# Patient Record
Sex: Male | Born: 1950 | Race: White | Hispanic: No | Marital: Married | Smoking: Never smoker
Health system: Southern US, Community
[De-identification: ages and names within clinical notes are randomized; demographics above are authoritative.]

## PROBLEM LIST (undated history)

## (undated) DIAGNOSIS — B1920 Unspecified viral hepatitis C without hepatic coma: Secondary | ICD-10-CM

## (undated) DIAGNOSIS — E7800 Pure hypercholesterolemia, unspecified: Secondary | ICD-10-CM

## (undated) DIAGNOSIS — M069 Rheumatoid arthritis, unspecified: Secondary | ICD-10-CM

## (undated) DIAGNOSIS — B159 Hepatitis A without hepatic coma: Secondary | ICD-10-CM

## (undated) DIAGNOSIS — I10 Essential (primary) hypertension: Secondary | ICD-10-CM

## (undated) DIAGNOSIS — K219 Gastro-esophageal reflux disease without esophagitis: Secondary | ICD-10-CM

## (undated) DIAGNOSIS — N529 Male erectile dysfunction, unspecified: Secondary | ICD-10-CM

## (undated) DIAGNOSIS — F419 Anxiety disorder, unspecified: Secondary | ICD-10-CM

## (undated) DIAGNOSIS — E119 Type 2 diabetes mellitus without complications: Secondary | ICD-10-CM

## (undated) HISTORY — DX: Type 2 diabetes mellitus without complications: E11.9

## (undated) HISTORY — DX: Unspecified viral hepatitis C without hepatic coma: B19.20

## (undated) HISTORY — DX: Male erectile dysfunction, unspecified: N52.9

## (undated) HISTORY — DX: Rheumatoid arthritis, unspecified: M06.9

## (undated) HISTORY — DX: Anxiety disorder, unspecified: F41.9

## (undated) HISTORY — DX: Hepatitis a without hepatic coma: B15.9

## (undated) HISTORY — DX: Pure hypercholesterolemia, unspecified: E78.00

## (undated) HISTORY — DX: Gastro-esophageal reflux disease without esophagitis: K21.9

## (undated) HISTORY — DX: Essential (primary) hypertension: I10

---

## 2009-12-03 ENCOUNTER — Ambulatory Visit (HOSPITAL_BASED_OUTPATIENT_CLINIC_OR_DEPARTMENT_OTHER): Admission: RE | Admit: 2009-12-03 | Discharge: 2009-12-03 | Payer: Self-pay | Admitting: Orthopedic Surgery

## 2010-04-30 LAB — POCT I-STAT, CHEM 8
Calcium, Ion: 1.14 mmol/L (ref 1.12–1.32)
Chloride: 107 mEq/L (ref 96–112)
HCT: 48 % (ref 39.0–52.0)
Hemoglobin: 16.3 g/dL (ref 13.0–17.0)
Potassium: 4.2 mEq/L (ref 3.5–5.1)
TCO2: 27 mmol/L (ref 0–100)

## 2016-06-07 DIAGNOSIS — K219 Gastro-esophageal reflux disease without esophagitis: Secondary | ICD-10-CM | POA: Diagnosis not present

## 2016-06-07 DIAGNOSIS — I10 Essential (primary) hypertension: Secondary | ICD-10-CM | POA: Diagnosis not present

## 2016-06-07 DIAGNOSIS — M069 Rheumatoid arthritis, unspecified: Secondary | ICD-10-CM | POA: Diagnosis not present

## 2016-06-07 DIAGNOSIS — Z8249 Family history of ischemic heart disease and other diseases of the circulatory system: Secondary | ICD-10-CM | POA: Diagnosis not present

## 2016-06-07 DIAGNOSIS — R0609 Other forms of dyspnea: Secondary | ICD-10-CM | POA: Diagnosis not present

## 2016-06-07 DIAGNOSIS — R0789 Other chest pain: Secondary | ICD-10-CM | POA: Diagnosis not present

## 2016-06-07 DIAGNOSIS — E119 Type 2 diabetes mellitus without complications: Secondary | ICD-10-CM | POA: Diagnosis not present

## 2016-06-08 DIAGNOSIS — R0609 Other forms of dyspnea: Secondary | ICD-10-CM | POA: Diagnosis not present

## 2016-06-08 DIAGNOSIS — E119 Type 2 diabetes mellitus without complications: Secondary | ICD-10-CM | POA: Diagnosis not present

## 2016-06-08 DIAGNOSIS — R0789 Other chest pain: Secondary | ICD-10-CM | POA: Diagnosis not present

## 2016-06-08 DIAGNOSIS — I10 Essential (primary) hypertension: Secondary | ICD-10-CM | POA: Diagnosis not present

## 2020-03-26 ENCOUNTER — Ambulatory Visit: Payer: Commercial Managed Care - PPO | Admitting: Gastroenterology

## 2020-04-18 ENCOUNTER — Ambulatory Visit: Payer: Commercial Managed Care - PPO | Admitting: Gastroenterology

## 2020-05-17 ENCOUNTER — Telehealth: Payer: Self-pay | Admitting: Gastroenterology

## 2020-05-17 NOTE — Telephone Encounter (Signed)
Hi Dr. Chales Abrahams, this pt used to see you in [facility]. He then transferred his care to a GI with [facility]. Pt was not pleased with his experience there so he would like to see you again. He is due for repeat colon but is having issues with his stomach that he would like to discuss with you. Records are in Epic. Could you please review them and advise if it is ok to schedule pt with you. Thank you.

## 2020-05-20 NOTE — Telephone Encounter (Signed)
Absolutely Okay to schedule with me or [facility] clinic-whichever is faster RG

## 2020-05-20 NOTE — Telephone Encounter (Signed)
Left message to call back to sch ov.   

## 2020-05-21 ENCOUNTER — Ambulatory Visit: Payer: Commercial Managed Care - PPO | Admitting: Gastroenterology

## 2020-05-23 ENCOUNTER — Encounter: Payer: Self-pay | Admitting: Gastroenterology

## 2020-06-19 ENCOUNTER — Other Ambulatory Visit (INDEPENDENT_AMBULATORY_CARE_PROVIDER_SITE_OTHER): Payer: Commercial Managed Care - PPO

## 2020-06-19 ENCOUNTER — Ambulatory Visit (INDEPENDENT_AMBULATORY_CARE_PROVIDER_SITE_OTHER): Payer: Commercial Managed Care - PPO | Admitting: Gastroenterology

## 2020-06-19 ENCOUNTER — Other Ambulatory Visit: Payer: Self-pay

## 2020-06-19 ENCOUNTER — Encounter: Payer: Self-pay | Admitting: Gastroenterology

## 2020-06-19 VITALS — BP 132/82 | HR 56 | Ht 68.5 in | Wt 175.2 lb

## 2020-06-19 DIAGNOSIS — R1084 Generalized abdominal pain: Secondary | ICD-10-CM

## 2020-06-19 DIAGNOSIS — Z860100 Personal history of colon polyps, unspecified: Secondary | ICD-10-CM

## 2020-06-19 DIAGNOSIS — D509 Iron deficiency anemia, unspecified: Secondary | ICD-10-CM | POA: Diagnosis not present

## 2020-06-19 DIAGNOSIS — K219 Gastro-esophageal reflux disease without esophagitis: Secondary | ICD-10-CM

## 2020-06-19 LAB — COMPREHENSIVE METABOLIC PANEL
ALT: 17 U/L (ref 0–53)
AST: 20 U/L (ref 0–37)
Albumin: 4.2 g/dL (ref 3.5–5.2)
Alkaline Phosphatase: 66 U/L (ref 39–117)
BUN: 23 mg/dL (ref 6–23)
CO2: 29 mEq/L (ref 19–32)
Calcium: 9.4 mg/dL (ref 8.4–10.5)
Chloride: 98 mEq/L (ref 96–112)
Creatinine, Ser: 0.98 mg/dL (ref 0.40–1.50)
GFR: 79.67 mL/min (ref >60.00)
Glucose, Bld: 125 mg/dL — ABNORMAL HIGH (ref 70–99)
Potassium: 4.8 mEq/L (ref 3.5–5.1)
Sodium: 134 mEq/L — ABNORMAL LOW (ref 135–145)
Total Bilirubin: 0.4 mg/dL (ref 0.2–1.2)
Total Protein: 6.8 g/dL (ref 6.0–8.3)

## 2020-06-19 MED ORDER — OMEPRAZOLE 40 MG PO CPDR: 40.0000 mg | DELAYED_RELEASE_CAPSULE | Freq: Every day | ORAL | 3 refills | Status: DC

## 2020-06-19 MED ORDER — NA SULFATE-K SULFATE-MG SULF 17.5-3.13-1.6 GM/177ML PO SOLN: 1.0000 | Freq: Once | ORAL | 0 refills | Status: AC

## 2020-06-19 NOTE — Progress Notes (Signed)
Chief Complaint: GI complaints  Referring Provider:  Dr Shary Decamp      ASSESSMENT AND PLAN;   #1. GERD with postprandial abdo pain  #2. H/O PSBO  #3. IDA with heme -ve stools.  #4. H/O Polyps  #5. Intermittent diarrhea- likely postcholecystectomy/IBS.  Could also be related to meds like metformin/olmesartan  Plan: -Would give him a trial of omeprazole 40mg  QD #90, 4 refills -CTE to r/o SB pathology given H/O PSBO in past -EGD with SB Bx/colon for further evaluation   I discussed the nature of the recommended EGD/Colonoscopy , as well as the indications, risks, alternatives and potential complications including, but not limited to, bleeding, infection, reaction to medication, damage to internal organs, cardiac and/or pulmonary problems, and perforation requiring surgery (1 to 2 in 1000). The possibility that significant findings could be missed was explained. All ? were answered. The patient gives consent for the procedures.   HPI:    Brent Mullins is a 70 y.o. male  With GERD, DM2, RA, HTN, HLD, anxiety, s/p lap chole 2014 with choledocholithiasis s/p ERCP Accompanied by his wife  With few GI issues -GERD: Longstanding >10ys, has been on Protonix in past.  Most recently he stopped taking Protonix as his insurance company stopped covering.  No odynophagia or dysphagia.  Started having heartburn ever since he stopped Protonix.  He has been taking Tums on as needed basis.  -Postprandial epigastric pain with fullness.  He apparently was admitted to [facility] (per wife) 2 to 3 years ago with partial small bowel obstruction.  He had NG tube and was managed conservatively.  He was told that he had "adhesions". "  He has never been the same".  He does admit to having postprandial nausea but no vomiting.  He also has longstanding history of diabetes.  Never had EGD/gastric emptying scan performed.  -Had diarrhea 1/day, ever since cholecystectomy.  No nocturnal symptoms.  Denies having any  urgency.  He has been on metformin for several years.  He did not notice any increased diarrhea.  He has also been on olmesartan for the last 3 years.  Denies having any history suggestive of gluten intolerance.  -Diagnosed with IDA with most recent hemoglobin 2 months ago at 13.6, normal MCV at 91.  His iron studies showed iron saturation of 24% with ferritin of 28.  He has been seen at [facility]) and has been advised EGD/colonoscopy which he never got around scheduling it.  Had heme-negative stools in the past.  He denies having any melena or hematochezia.  No history of nosebleeds.  No hematuria.  No history of easy bruisability.  No family history of colon cancer or colonic polyps.  No alcohol.  No jaundice dark urine or pale stools.  No sodas, chocolates, chewing gums, artificial sweeteners and candy. No NSAIDs  Past GI procedures: Colonoscopy 08/2015 ([facility]) good preparation.  Colonic polyp s/p polypectomy.  Moderate predominantly sigmoid diverticulosis.  Otherwise normal to TI. Bx- neg  ERCP 06/2012: Choledocholithiasis s/p biliary sphincterotomy and stone extraction.  CT ar [facility]- per patient's wife.  I could not get any information through epic.  Report awaited. Past Medical History:  Diagnosis Date  . Anxiety   . Diabetes mellitus (HCC)   . GERD (gastroesophageal reflux disease)   . Hepatitis A   . Hepatitis-C    Dr Kinnie Scales. False positive hepatitis C antibodies  . Hypercholesterolemia   . Hypertension   . Organic impotence   . RA (rheumatoid  arthritis) [facility])     Past Surgical History:  Procedure Laterality Date  . CHOLECYSTECTOMY  2014   With choledocholithiasis s/p ERCP with ES  . COLONOSCOPY  09/11/2015   Colonic polyp status post polypectomy. Moderate predominantly sigmoid diverticulosis  . ERCP  07/15/2012   Choledocholithiasis staus post biliary sphrincterotomy with stone extraction.  Marland Kitchen HAND SURGERY Right   . INGUINAL HERNIA REPAIR  07/13/2003   S/p inguinal  herniorraphy     Family History  Problem Relation Age of Onset  . Rheumatologic disease Father     Social History   Tobacco Use  . Smoking status: Never Smoker  . Smokeless tobacco: Never Used  Substance Use Topics  . Alcohol use: Yes  . Drug use: Never    Current Outpatient Medications  Medication Sig Dispense Refill  . escitalopram (LEXAPRO) 10 MG tablet Take 1 tablet by mouth daily.    . ferrous sulfate 325 (65 FE) MG EC tablet Take 1 tablet by mouth daily with breakfast.    . metFORMIN (GLUCOPHAGE-XR) 500 MG 24 hr tablet Take by mouth.    . Multiple Vitamin (MULTIVITAMIN) capsule Take 1 capsule by mouth daily.    . Olmesartan-amLODIPine-HCTZ 40-5-25 MG TABS Take by mouth.    Dub Amis 125 MG/ML SOSY Inject into the skin.    . pantoprazole (PROTONIX) 40 MG tablet Take 1 tablet by mouth daily.    . polyvinyl alcohol (LIQUIFILM TEARS) 1.4 % ophthalmic solution Place 1 drop into both eyes as needed.     No current facility-administered medications for this visit.    No Known Allergies  Review of Systems:  Constitutional: Denies fever, chills, diaphoresis, appetite change and has fatigue.  HEENT: Denies photophobia, eye pain, redness, hearing loss, ear pain, congestion, sore throat, rhinorrhea, sneezing, mouth sores, neck pain, neck stiffness and tinnitus.   Respiratory: Denies SOB, DOE, cough, chest tightness,  and wheezing.   Cardiovascular: Denies chest pain, palpitations and leg swelling.  Genitourinary: Denies dysuria, urgency, frequency, hematuria, flank pain and difficulty urinating.  Musculoskeletal: Denies myalgias, has back pain, joint swelling, arthralgias and gait problem.  Skin: No rash.  Neurological: Denies dizziness, seizures, syncope, weakness, light-headedness, numbness and headaches.  Hematological: Denies adenopathy. Easy bruising, personal or family bleeding history  Psychiatric/Behavioral: Has anxiety or depression     Physical Exam:    BP 132/82  (BP Location: Left Arm, Patient Position: Sitting, Cuff Size: Normal)   Pulse (!) 56   Ht 5' 8.5" (1.74 m)   Wt 175 lb 4 oz (79.5 kg)   BMI 26.26 kg/m  Wt Readings from Last 3 Encounters:  06/19/20 175 lb 4 oz (79.5 kg)   Constitutional:  Well-developed, in no acute distress. Psychiatric: Normal mood and affect. Behavior is normal. HEENT: Pupils normal.  Conjunctivae are normal. No scleral icterus.  Cardiovascular: Normal rate, regular rhythm. No edema Pulmonary/chest: Effort normal and breath sounds normal. No wheezing, rales or rhonchi. Abdominal: Soft, nondistended. Nontender. Bowel sounds active throughout. There are no masses palpable. No hepatomegaly. Rectal: To be performed at the time of colonoscopy Neurological: Alert and oriented to person place and time. Skin: Skin is warm and dry. No rashes noted.  Data Reviewed: I have personally reviewed following labs and imaging studies  CBC Latest Ref Rng & Units 12/03/2009  Hemoglobin 13.0 - 17.0 g/dL 21.3  Hematocrit 08.6 - 52.0 % 48.0   CMP Latest Ref Rng & Units 12/03/2009  Glucose 70 - 99 mg/dL 578(I)  BUN 6 -  23 mg/dL 08(Q)  Creatinine 0.4 - 1.5 mg/dL 1.2  Sodium 761 - 950 mEq/L 141  Potassium 3.5 - 5.1 mEq/L 4.2  Chloride 96 - 112 mEq/L 107      Edman Circle, MD 06/19/2020, 9:37 AM  Cc: Dr. Shary Decamp

## 2020-06-19 NOTE — Patient Instructions (Addendum)
If you are age 68 or older, your body mass index should be between 23-30. Your Body mass index is 26.26 kg/m. If this is out of the aforementioned range listed, please consider follow up with your Primary Care Provider.  If you are age 62 or younger, your body mass index should be between 19-25. Your Body mass index is 26.26 kg/m. If this is out of the aformentioned range listed, please consider follow up with your Primary Care Provider.   You have been scheduled for a ct enterography at [facility] (First floor of hospital)  on May 16,2022  at  10am.   Please arrive 1 hour and 15 minutes prior to scheduled appointment time. Nothing by mouth 4 hours prior to this appointment. If you have any questions please call [phone].    Please go to the lab on the 2nd floor [address] before you leave the office today.   You have been scheduled for an endoscopy and colonoscopy. Please follow the written instructions given to you at your visit today. Please pick up your prep supplies at the pharmacy within the next 1-3 days. If you use inhalers (even only as needed), please bring them with you on the day of your procedure.  Thank you,  Dr. Lynann Bologna

## 2020-06-25 ENCOUNTER — Telehealth: Payer: Self-pay | Admitting: Gastroenterology

## 2020-06-25 NOTE — Telephone Encounter (Signed)
Wife was given [phone] to reschedule

## 2020-06-25 NOTE — Telephone Encounter (Signed)
Patients wife calling to reschedule CT appointment.

## 2020-07-01 ENCOUNTER — Ambulatory Visit (HOSPITAL_COMMUNITY): Payer: Commercial Managed Care - PPO

## 2020-07-04 ENCOUNTER — Ambulatory Visit (HOSPITAL_COMMUNITY): Payer: Commercial Managed Care - PPO

## 2020-07-17 ENCOUNTER — Encounter: Payer: Self-pay | Admitting: Gastroenterology

## 2020-07-26 ENCOUNTER — Other Ambulatory Visit: Payer: Self-pay

## 2020-07-26 ENCOUNTER — Ambulatory Visit
Admission: RE | Admit: 2020-07-26 | Discharge: 2020-07-26 | Disposition: A | Discharge status: Home / Self Care | Payer: Commercial Managed Care - PPO | Source: Ambulatory Visit | Attending: Gastroenterology | Admitting: Gastroenterology

## 2020-07-26 DIAGNOSIS — R1084 Generalized abdominal pain: Secondary | ICD-10-CM | POA: Insufficient documentation

## 2020-07-26 DIAGNOSIS — K219 Gastro-esophageal reflux disease without esophagitis: Secondary | ICD-10-CM | POA: Insufficient documentation

## 2020-07-26 DIAGNOSIS — Z86010 Personal history of colonic polyps: Secondary | ICD-10-CM | POA: Insufficient documentation

## 2020-07-26 DIAGNOSIS — D509 Iron deficiency anemia, unspecified: Secondary | ICD-10-CM | POA: Insufficient documentation

## 2020-07-26 IMAGING — CT CT ENTEROGRAPHY (ABD-PELV W/ CM)
2 of 6 series · 15 of 46 positions shown, 17 images · IV contrast (APPLIED)
Comparison: [DATE]

CLINICAL DATA: Postprandial abdominal pain, GERD, iron deficiency
anemia, status post cholecystectomy

EXAM:
CT ABDOMEN AND PELVIS WITH CONTRAST (ENTEROGRAPHY)
TECHNIQUE: Multidetector CT of the abdomen and pelvis during bolus
administration of intravenous contrast. Negative oral contrast was
given.
CONTRAST:  100mL OMNIPAQUE IOHEXOL 300 MG/ML SOLN, additional
negative oral enteric contrast

[Series 4: entero thins · axial · 0.76mm/px · z∈[-507,-81]mm · 12 of 241 slices shown, 14 images]
[im 14/241  soft-tissue]
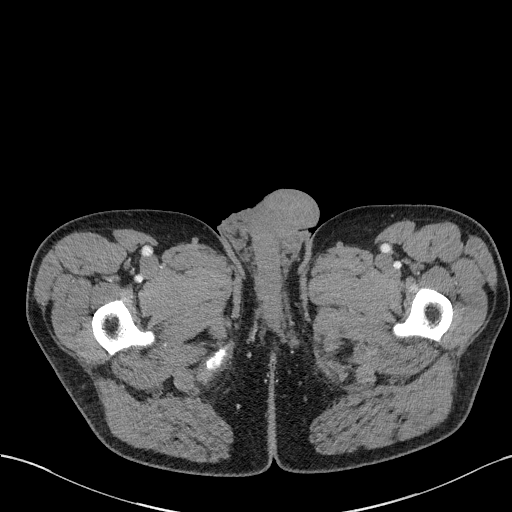
[im 14/241  bone]
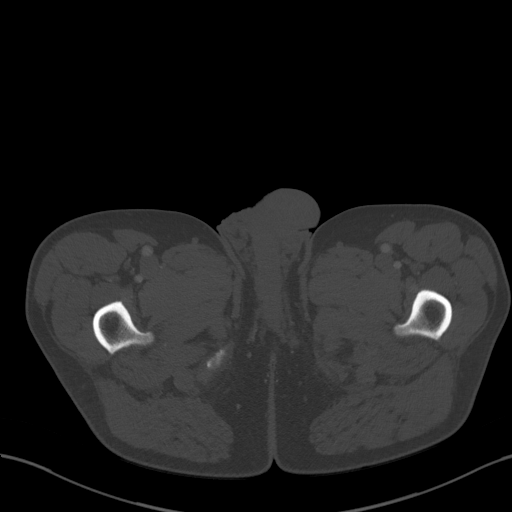
[im 41/241  soft-tissue]
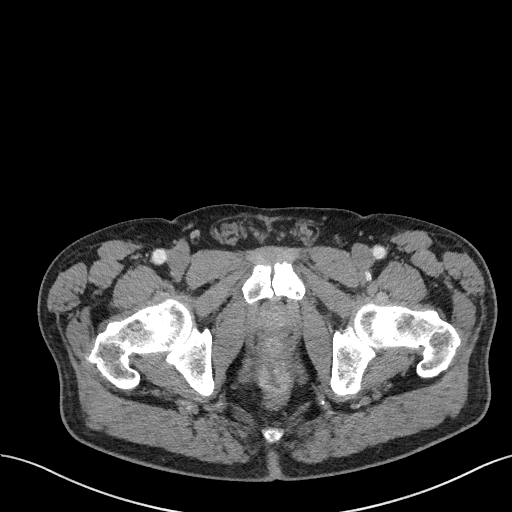
[im 54/241  soft-tissue]
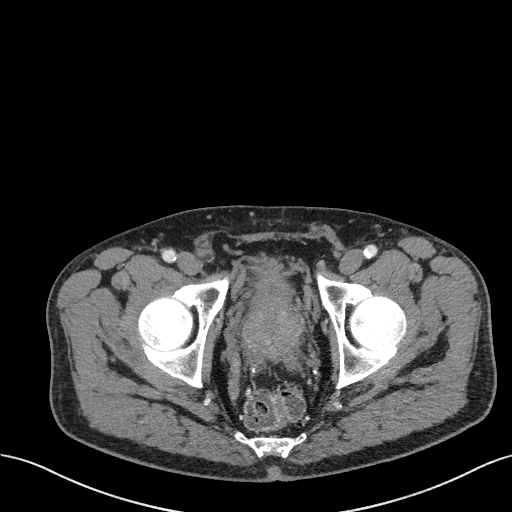
[im 67/241  soft-tissue]
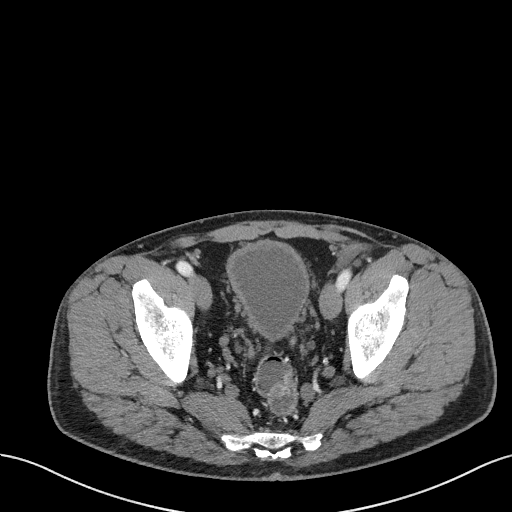
[im 94/241  soft-tissue]
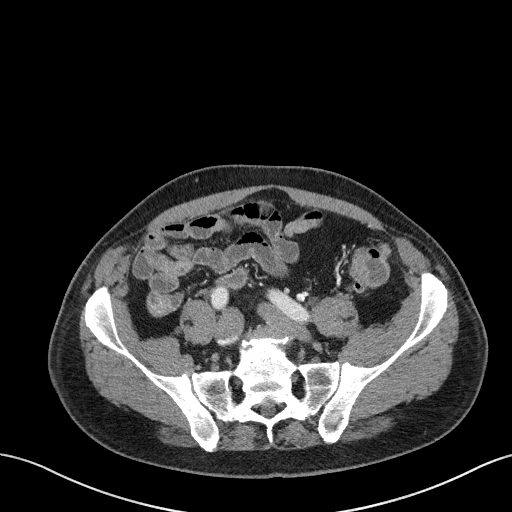
[im 107/241  soft-tissue]
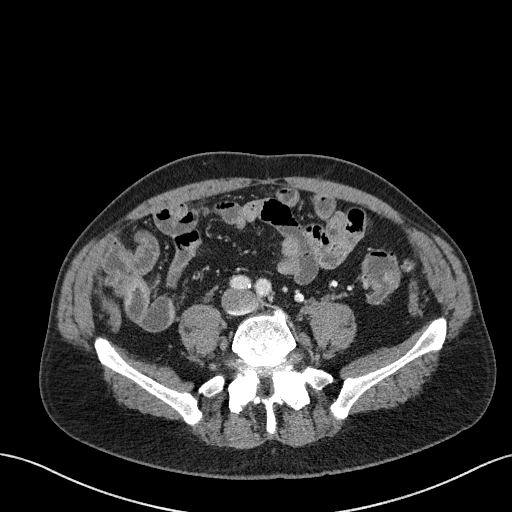
[im 134/241  soft-tissue]
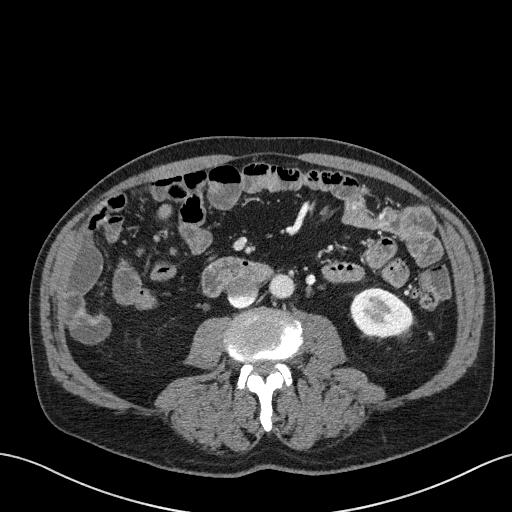
[im 147/241  soft-tissue]
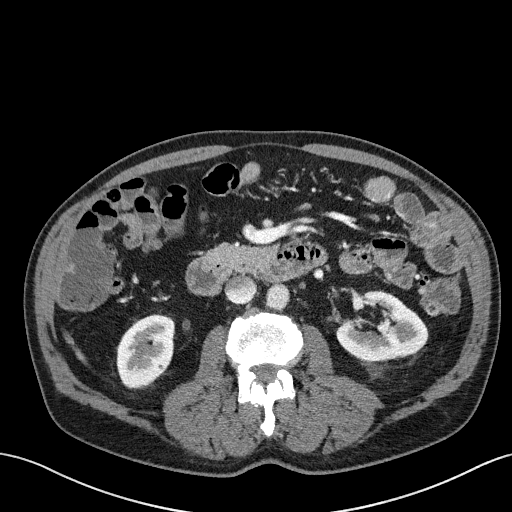
[im 174/241  soft-tissue]
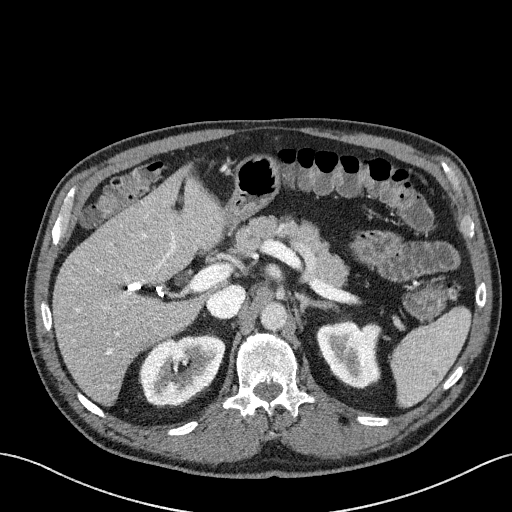
[im 174/241  bone]
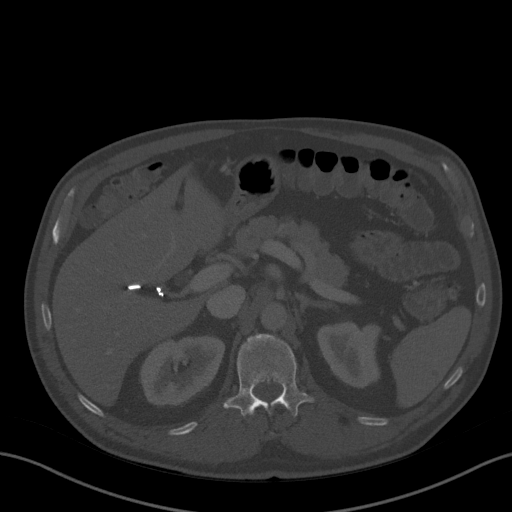
[im 187/241  soft-tissue]
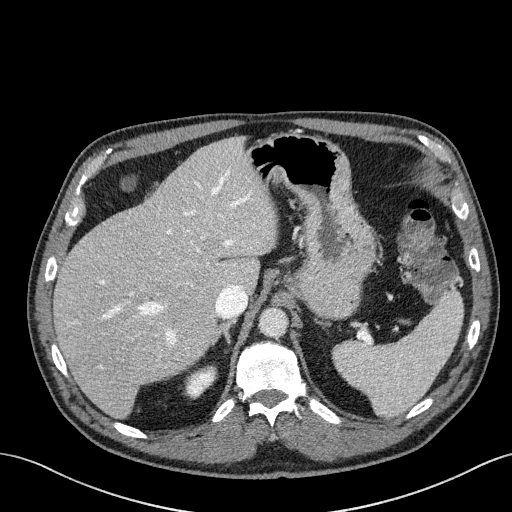
[im 201/241  soft-tissue]
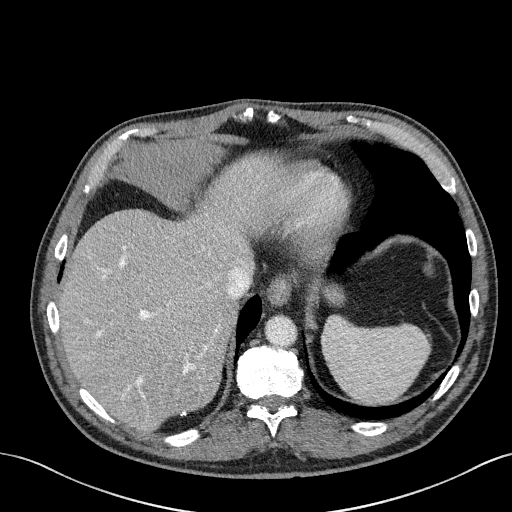
[im 227/241  soft-tissue]
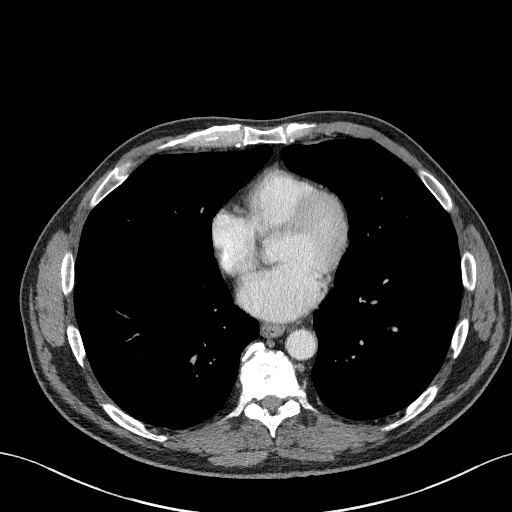

[Series 6: coronal · coronal · 0.78mm/px · 3 of 99 slices shown]
[im 33/99  soft-tissue]
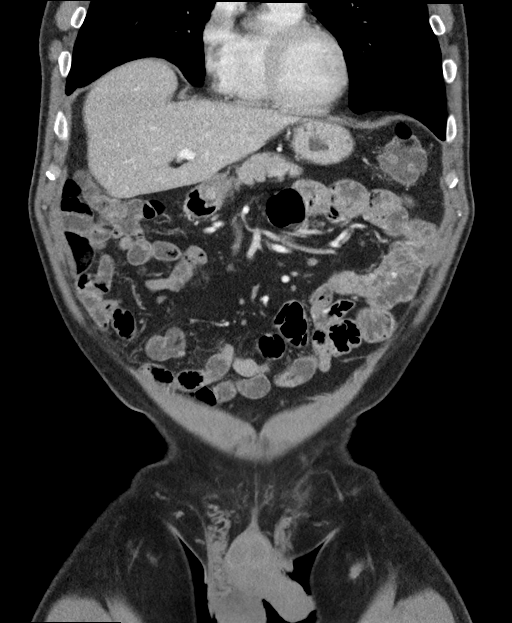
[im 44/99  soft-tissue]
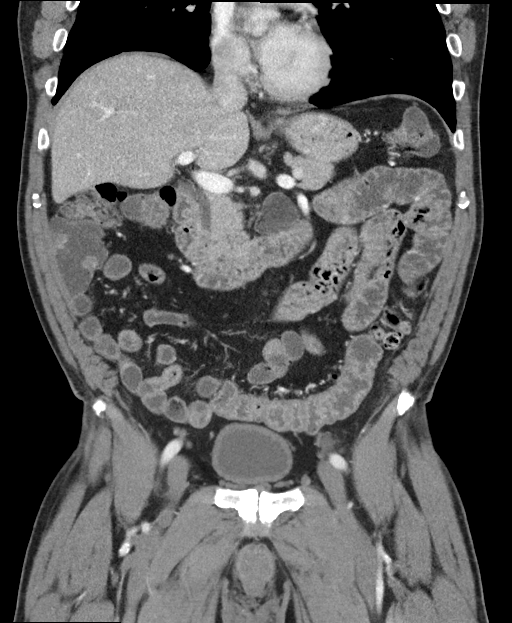
[im 55/99  soft-tissue]
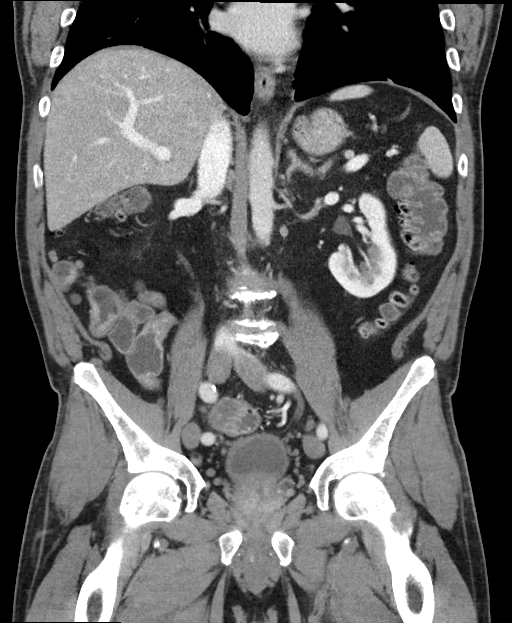

[15 of 46 positions shown; findings below may reference images not displayed]

FINDINGS: Lower chest: No acute abnormality. Stable, definitively benign 7 mm
pulmonary nodule of the left lung base (series 5, image 13).

Hepatobiliary: No focal liver abnormality is seen. Status post
cholecystectomy. No biliary dilatation.

Pancreas: Unremarkable. No pancreatic ductal dilatation or
surrounding inflammatory changes.

Spleen: Normal in size without significant abnormality.

Adrenals/Urinary Tract: Adrenal glands are unremarkable. Kidneys are
normal, without renal calculi, solid lesion, or hydronephrosis. Mild
urinary bladder wall thickening.

Stomach/Bowel: Stomach is within normal limits. Appendix appears
normal. There are multiple loops of distal ileum, which are somewhat
thickened and narrowed appearing (series 6, image 57, 48, series 2,
image 56). There is mild mucosal hyperenhancement of the sigmoid
colon (series 2, image 65). Pancolonic diverticulosis, most notable
in the descending and sigmoid colon.

Vascular/Lymphatic: No significant vascular findings are present. No
enlarged abdominal or pelvic lymph nodes.

Reproductive: No mass or other significant abnormality.

Other: No abdominal wall hernia or abnormality. No abdominopelvic
ascites.

Musculoskeletal: No acute or significant osseous findings.
IMPRESSION: 1. There are multiple loops of distal ileum, which are somewhat
thickened and narrowed appearing. One of these loops corresponds to
a previously identified nidus of distal small bowel obstruction,
which has since been relieved. This appearance presumably reflects
some degree of stricture without evidence of current obstruction.
2. There is mild mucosal hyperenhancement of the sigmoid colon.
3. Constellation of findings is generally in keeping with
inflammatory bowel disease, particularly Crohn's disease, with
minimal evidence of acute inflammation on this examination.
4. Pancolonic diverticulosis, most notable in the descending and
sigmoid colon.
5. Mild urinary bladder wall thickening, likely related to chronic
outlet obstruction.

## 2020-07-26 MED ORDER — IOHEXOL 300 MG/ML  SOLN
100.0000 mL | Freq: Once | INTRAMUSCULAR | Status: AC | PRN
  Administered 2020-07-26: 100 mL via INTRAVENOUS

## 2020-07-31 ENCOUNTER — Other Ambulatory Visit: Payer: Self-pay

## 2020-07-31 ENCOUNTER — Encounter: Payer: Self-pay | Admitting: Gastroenterology

## 2020-07-31 ENCOUNTER — Ambulatory Visit (AMBULATORY_SURGERY_CENTER): Payer: Commercial Managed Care - PPO | Admitting: Gastroenterology

## 2020-07-31 VITALS — BP 122/81 | HR 73 | Temp 97.7°F | Resp 22 | Ht 68.0 in | Wt 175.0 lb

## 2020-07-31 DIAGNOSIS — K5730 Diverticulosis of large intestine without perforation or abscess without bleeding: Secondary | ICD-10-CM

## 2020-07-31 DIAGNOSIS — Z860100 Personal history of colon polyps, unspecified: Secondary | ICD-10-CM

## 2020-07-31 DIAGNOSIS — D509 Iron deficiency anemia, unspecified: Secondary | ICD-10-CM

## 2020-07-31 DIAGNOSIS — K219 Gastro-esophageal reflux disease without esophagitis: Secondary | ICD-10-CM | POA: Diagnosis not present

## 2020-07-31 DIAGNOSIS — K2289 Other specified disease of esophagus: Secondary | ICD-10-CM

## 2020-07-31 DIAGNOSIS — K5289 Other specified noninfective gastroenteritis and colitis: Secondary | ICD-10-CM

## 2020-07-31 DIAGNOSIS — K2970 Gastritis, unspecified, without bleeding: Secondary | ICD-10-CM

## 2020-07-31 DIAGNOSIS — K449 Diaphragmatic hernia without obstruction or gangrene: Secondary | ICD-10-CM

## 2020-07-31 DIAGNOSIS — K648 Other hemorrhoids: Secondary | ICD-10-CM | POA: Diagnosis not present

## 2020-07-31 DIAGNOSIS — K3189 Other diseases of stomach and duodenum: Secondary | ICD-10-CM

## 2020-07-31 MED ORDER — PANTOPRAZOLE SODIUM 40 MG PO TBEC: 40.0000 mg | DELAYED_RELEASE_TABLET | Freq: Every day | ORAL | 3 refills | Status: AC

## 2020-07-31 MED ORDER — SODIUM CHLORIDE 0.9 % IV SOLN: 500.0000 mL | Freq: Once | INTRAVENOUS | Status: DC

## 2020-07-31 NOTE — Op Note (Signed)
[facility] Patient Name: Brent Mullins Procedure Date: 07/31/2020 2:46 PM MRN: [id number] Endoscopist: Lynann Bologna , MD Age: 70 Referring MD:  Date of Birth: 07-16-1950 Gender: Male Account #: [id number] Procedure:                Colonoscopy Indications:              High risk colon cancer surveillance: Personal                            history of colonic polyps, IDA Medicines:                Monitored Anesthesia Care Procedure:                Pre-Anesthesia Assessment:                           - Prior to the procedure, a History and Physical                            was performed, and patient medications and                            allergies were reviewed. The patient's tolerance of                            previous anesthesia was also reviewed. The risks                            and benefits of the procedure and the sedation                            options and risks were discussed with the patient.                            All questions were answered, and informed consent                            was obtained. Prior Anticoagulants: The patient has                            taken no previous anticoagulant or antiplatelet                            agents. ASA Grade Assessment: II - A patient with                            mild systemic disease. After reviewing the risks                            and benefits, the patient was deemed in                            satisfactory condition to undergo the procedure.  After obtaining informed consent, the colonoscope                            was passed under direct vision. Throughout the                            procedure, the patient's blood pressure, pulse, and                            oxygen saturations were monitored continuously. The                            Olympus CF-HQ190L (Serial# [id number]) Colonoscope was                            introduced through the anus and  advanced to the 2                            cm into the ileum. The colonoscopy was performed                            without difficulty. The patient tolerated the                            procedure well. The quality of the bowel                            preparation was good. The terminal ileum, ileocecal                            valve, appendiceal orifice, and rectum were                            photographed. Scope In: 3:09:56 PM Scope Out: 3:29:12 PM Scope Withdrawal Time: 0 hours 14 minutes 52 seconds  Total Procedure Duration: 0 hours 19 minutes 16 seconds  Findings:                 Multiple medium-mouthed diverticula were found in                            the sigmoid colon, few in descending colon and                            ascending colon.                           The colon (entire examined portion) appeared                            normal. Biopsies for histology were taken with a                            cold forceps from the entire colon for evaluation  of microscopic colitis.                           Non-bleeding internal hemorrhoids were found during                            retroflexion. The hemorrhoids were moderate.                           A localized area of mucosa in the terminal ileum                            was mildly erythematous with 2-3 small superficial                            erosions at IC valve. Biopsies were taken with a                            cold forceps for histology.                           The exam was otherwise without abnormality on                            direct and retroflexion views. Complications:            No immediate complications. Estimated Blood Loss:     Estimated blood loss: none. Impression:               - Pancolonic diverticulosis predominantly in the                            sigmoid colon.                           - Erythematous mucosa in the terminal ileum with                             few erosions. Biopsied to r/o Crohn's disease.                           - Otherwise normal colonoscopy. Recommendation:           - Patient has a contact number available for                            emergencies. The signs and symptoms of potential                            delayed complications were discussed with the                            patient. Return to normal activities tomorrow.                            Written discharge instructions were provided to the  patient.                           - Resume previous diet.                           - Continue present medications.                           - Await pathology results.                           - Repeat colonoscopy for surveillance based on                            pathology results.                           - The findings and recommendations were discussed                            with the patient's family. Lynann Bologna, MD 07/31/2020 3:41:35 PM This report has been signed electronically.

## 2020-07-31 NOTE — Progress Notes (Signed)
Pt's states no medical or surgical changes since previsit or office visit. 

## 2020-07-31 NOTE — Progress Notes (Signed)
Called to room to assist during endoscopic procedure.  Patient ID and intended procedure confirmed with present staff. Received instructions for my participation in the procedure from the performing physician.  

## 2020-07-31 NOTE — Progress Notes (Signed)
PT taken to PACU. Monitors in place. VSS. Report given to RN. 

## 2020-07-31 NOTE — Op Note (Signed)
[facility] Patient Name: Brent Mullins Procedure Date: 07/31/2020 2:47 PM MRN: [id number] Endoscopist: Lynann Bologna , MD Age: 70 Referring MD:  Date of Birth: December 14, 1950 Gender: Male Account #: [id number] Procedure:                Upper GI endoscopy Indications:              Epigastric abdominal pain. IDA, GERD Medicines:                Monitored Anesthesia Care Procedure:                Pre-Anesthesia Assessment:                           - Prior to the procedure, a History and Physical                            was performed, and patient medications and                            allergies were reviewed. The patient's tolerance of                            previous anesthesia was also reviewed. The risks                            and benefits of the procedure and the sedation                            options and risks were discussed with the patient.                            All questions were answered, and informed consent                            was obtained. Prior Anticoagulants: The patient has                            taken no previous anticoagulant or antiplatelet                            agents. ASA Grade Assessment: II - A patient with                            mild systemic disease. After reviewing the risks                            and benefits, the patient was deemed in                            satisfactory condition to undergo the procedure.                           After obtaining informed consent, the endoscope was  passed under direct vision. Throughout the                            procedure, the patient's blood pressure, pulse, and                            oxygen saturations were monitored continuously. The                            Endoscope was introduced through the mouth, and                            advanced to the second part of duodenum. The upper                            GI endoscopy was  accomplished without difficulty.                            The patient tolerated the procedure well. Scope In: Scope Out: Findings:                 Few linear and circular furrows were noted in the                            proximal and mid esophagus. Multiple biopsies were                            obtained to rule out eosinophilic esophagitis.                            There were esophageal mucosal changes suspicious                            for short-segment Barrett's esophagus present in                            the lower third of the esophagus. The maximum                            longitudinal extent of these mucosal changes was 2                            cm in length with the most proximal margin at 33                            cm, examined by NBI. This was biopsied with a cold                            forceps for histology.                           A 5 cm hiatal hernia was present extending from 35  up to 40 cm (GE junction up to diaphragmatic                            hiatus).                           Scattered mild inflammation characterized by                            erythema and granularity was found in the entire                            examined stomach. The antrum was mildly deformed.                            No acute ulcers. Multiple biopsies were taken with                            a cold forceps for histology.                           The examined duodenum was normal. Biopsies for                            histology were taken with a cold forceps for                            evaluation of celiac disease. Incidental note was                            made of a small 8 mm periampullary diverticulum. Complications:            No immediate complications. Estimated Blood Loss:     Estimated blood loss: none. Impression:               - Esophageal mucosal changes suspicious for                            short-segment  Barrett's esophagus. Biopsied.                           - 5 cm hiatal hernia.                           - Gastritis. Biopsied. Recommendation:           - Patient has a contact number available for                            emergencies. The signs and symptoms of potential                            delayed complications were discussed with the                            patient. Return to normal activities tomorrow.  Written discharge instructions were provided to the                            patient.                           - Resume previous diet.                           - Continue present medications. Resume Protonix 40                            mg p.o. once a day #30, 11 refills                           - Await pathology results.                           - Return to GI clinic in 12 weeks. Lynann Bologna, MD 07/31/2020 3:37:19 PM This report has been signed electronically.

## 2020-07-31 NOTE — Progress Notes (Signed)
C.W. vital signs. 

## 2020-07-31 NOTE — Patient Instructions (Addendum)
Take your new medication as ordered.  Read all of the handouts given to you by your recovery room nurse.  YOU HAD AN ENDOSCOPIC PROCEDURE TODAY AT THE [facility]:   Refer to the procedure report that was given to you for any specific questions about what was found during the examination.  If the procedure report does not answer your questions, please call your gastroenterologist to clarify.  If you requested that your care partner not be given the details of your procedure findings, then the procedure report has been included in a sealed envelope for you to review at your convenience later.  YOU SHOULD EXPECT: Some feelings of bloating in the abdomen. Passage of more gas than usual.  Walking can help get rid of the air that was put into your GI tract during the procedure and reduce the bloating. If you had a lower endoscopy (such as a colonoscopy or flexible sigmoidoscopy) you may notice spotting of blood in your stool or on the toilet paper. If you underwent a bowel prep for your procedure, you may not have a normal bowel movement for a few days.  Please Note:  You might notice some irritation and congestion in your nose or some drainage.  This is from the oxygen used during your procedure.  There is no need for concern and it should clear up in a day or so.  SYMPTOMS TO REPORT IMMEDIATELY:  Following lower endoscopy (colonoscopy or flexible sigmoidoscopy):  Excessive amounts of blood in the stool  Significant tenderness or worsening of abdominal pains  Swelling of the abdomen that is new, acute  Fever of 100F or higher  Following upper endoscopy (EGD)  Vomiting of blood or coffee ground material  New chest pain or pain under the shoulder blades  Painful or persistently difficult swallowing  New shortness of breath  Fever of 100F or higher  Black, tarry-looking stools  For urgent or emergent issues, a gastroenterologist can be reached at any hour by calling [phone]  [phone]. Do not use MyChart messaging for urgent concerns.    DIET:  We do recommend a small meal at first, but then you may proceed to your regular diet.  Drink plenty of fluids but you should avoid alcoholic beverages for 24 hours.  ACTIVITY:  You should plan to take it easy for the rest of today and you should NOT DRIVE or use heavy machinery until tomorrow (because of the sedation medicines used during the test).    FOLLOW UP: Our staff will call the number listed on your records 48-72 hours following your procedure to check on you and address any questions or concerns that you may have regarding the information given to you following your procedure. If we do not reach you, we will leave a message.  We will attempt to reach you two times.  During this call, we will ask if you have developed any symptoms of COVID 19. If you develop any symptoms (ie: fever, flu-like symptoms, shortness of breath, cough etc.) before then, please call [phone].  If you test positive for Covid 19 in the 2 weeks post procedure, please call and report this information to [place].    If any biopsies were taken you will be contacted by phone or by letter within the next 1-3 weeks.  Please call us at [phone] if you have not heard about the biopsies in 3 weeks.    SIGNATURES/CONFIDENTIALITY: You and/or your care partner have signed paperwork which will be  entered into your electronic medical record.  These signatures attest to the fact that that the information above on your After Visit Summary has been reviewed and is understood.  Full responsibility of the confidentiality of this discharge information lies with you and/or your care-partner.     High fiber diet,read all handouts given to you by recovery nurse.

## 2020-08-01 ENCOUNTER — Telehealth: Payer: Self-pay

## 2020-08-01 DIAGNOSIS — Z86010 Personal history of colonic polyps: Secondary | ICD-10-CM

## 2020-08-01 DIAGNOSIS — K219 Gastro-esophageal reflux disease without esophagitis: Secondary | ICD-10-CM

## 2020-08-01 DIAGNOSIS — K2970 Gastritis, unspecified, without bleeding: Secondary | ICD-10-CM

## 2020-08-01 DIAGNOSIS — D509 Iron deficiency anemia, unspecified: Secondary | ICD-10-CM

## 2020-08-01 NOTE — Telephone Encounter (Signed)
-----   Message from Lynann Bologna, MD sent at 07/31/2020  4:50 PM EDT ----- CT reviewed Had EGD/colonoscopy today.  TI did show few erosions.  Biopsies were performed.  Will await for biopsies.  Plan: -Await pathology -FU in my clinic in 2-3 weeks. -Check CBC, CMP, CRP prior to visit  RG

## 2020-08-01 NOTE — Telephone Encounter (Signed)
LVM for patient to call back. I need to talk with him about getting him scheduled back in the office.      Orders in the computer already for lab work to go to the [facility] care office on second floor

## 2020-08-02 ENCOUNTER — Telehealth: Payer: Self-pay | Admitting: *Deleted

## 2020-08-02 NOTE — Telephone Encounter (Signed)
  Follow up Call-  Call back number 07/31/2020  Post procedure Call Back phone  # [phone]  Permission to leave phone message Yes  Some recent data might be hidden     Patient questions:  Do you have a fever, pain , or abdominal swelling? No. Pain Score  0 *  Have you tolerated food without any problems? Yes.    Have you been able to return to your normal activities? Yes.    Do you have any questions about your discharge instructions: Diet   No. Medications  No. Follow up visit  No.  Do you have questions or concerns about your Care? No.  Actions: * If pain score is 4 or above: No action needed, pain <4.Have you developed a fever since your procedure? no  2.   Have you had an respiratory symptoms (SOB or cough) since your procedure? no  3.   Have you tested positive for COVID 19 since your procedure no  4.   Have you had any family members/close contacts diagnosed with the COVID 19 since your procedure?  no   If yes to any of these questions please route to Laverna Peace, RN and Karlton Lemon, RN

## 2020-08-02 NOTE — Telephone Encounter (Signed)
Appt 7/20 and lab work 7/11 due to patient's 2 week vacation

## 2020-08-07 ENCOUNTER — Encounter: Payer: Self-pay | Admitting: Gastroenterology

## 2020-08-27 NOTE — Telephone Encounter (Signed)
Inbound call from patient's wife stating he was not able to go get his lab work done and is requesting to reschedule please.  Also has questions about a medication patient is currently taking.

## 2020-08-28 NOTE — Telephone Encounter (Signed)
Called twice and LVM for patient to call back.

## 2020-08-28 NOTE — Telephone Encounter (Signed)
Wife said that patient takes Protonix 40 mg daily but when he is having symptoms of GERD he will take Prolisec 40mg . She said she doesn't think the Protonix is working for the patient. He will try to get him to do his lab work this Friday the 15th and she is aware we wanted his labs prior to his office visit on the 20th

## 2020-08-28 NOTE — Telephone Encounter (Signed)
LVM

## 2020-08-29 MED ORDER — OMEPRAZOLE 40 MG PO CPDR: 40.0000 mg | DELAYED_RELEASE_CAPSULE | Freq: Every day | ORAL | 3 refills | Status: AC

## 2020-08-29 MED ORDER — OMEPRAZOLE 40 MG PO CPDR: 40.0000 mg | DELAYED_RELEASE_CAPSULE | Freq: Every day | ORAL | 3 refills | Status: DC

## 2020-08-29 NOTE — Telephone Encounter (Signed)
If Prilosec is working better, lets switch Protonix to Prilosec 40mg  po qd, #30, 11 refills RG

## 2020-08-29 NOTE — Addendum Note (Signed)
Addended by: Alberteen Sam E on: 08/29/2020 02:33 PM   Modules accepted: Orders

## 2020-08-29 NOTE — Telephone Encounter (Signed)
Patient's wife wants [facility] now so script sent there

## 2020-08-29 NOTE — Telephone Encounter (Signed)
Patient's wife she would like to switch so medication sent to the [facility] pharmacy

## 2020-09-04 ENCOUNTER — Encounter: Payer: Self-pay | Admitting: Gastroenterology

## 2020-09-04 ENCOUNTER — Other Ambulatory Visit: Payer: Self-pay

## 2020-09-04 ENCOUNTER — Ambulatory Visit (INDEPENDENT_AMBULATORY_CARE_PROVIDER_SITE_OTHER): Payer: Commercial Managed Care - PPO | Admitting: Gastroenterology

## 2020-09-04 VITALS — BP 132/80 | HR 75 | Ht 68.0 in | Wt 175.2 lb

## 2020-09-04 DIAGNOSIS — K449 Diaphragmatic hernia without obstruction or gangrene: Secondary | ICD-10-CM | POA: Diagnosis not present

## 2020-09-04 DIAGNOSIS — K219 Gastro-esophageal reflux disease without esophagitis: Secondary | ICD-10-CM

## 2020-09-04 NOTE — Progress Notes (Signed)
Chief Complaint: FU  Referring Provider:  Dr Shary Decamp      ASSESSMENT AND PLAN;   #1. GERD with mod HH with short segment Barrett's esophagus on EGD 07/2020.  #2. Inactive Crohn's disease involving distal ileum-more fibrotic component than inflammatory.   H/O PSBO adm to [facility] 3-4 yrs ago (managed conservatively). Nl CBC, CMP, CRP (08/2020)  #3. IDA with heme -ve stools (resolved). Hb 14.6 (08/2020)   Plan: -Continue omeprazole 40mg  QD #90, 4 refills -He does understand that he is at risk of having PSBO in future.  He will promptly get in touch with in case of any problems. -At this time, since Crohn's appears to be inactive, we decided to hold off on treatment.  Besides, interestingly, he has been on Humira in past for RA and is currently on Orencia. -Rpt EGD and colon in 3 years.      HPI:    Brent Mullins is a 70 y.o. male  With GERD, DM2, RA(humira stopped working, now on 79, HTN, HLD, anxiety, s/p lap chole 2014 with choledocholithiasis s/p ERCP  Very pleasant patient.  Underwent CTE 07/2020 showing multiple loops of distal ileum with thickening c/w Crohn's disease.  He underwent colonoscopy which showed few erosions in TI. Bx came back as inactive Crohn's disease.  EGD showed 5 cm HH with short segment Barrett's esophagus without dysplasia.  He had normal CBC, CMP and CRP.  His GI symptoms have more or less resolved completely.  No further heartburn, nausea, vomiting, abdominal pain or abdominal distention.  Interestingly, he has been on Humira for several years due to RA and has recently been switched to Orencia (selective T-cell cord stimulation blocker)-which likely is effective for Crohn's disease as well.  Also, he took several courses of steroids for rheumatoid arthritis in past.    Past GI procedures:  EGD 07/2020 - Esophageal mucosal changes suspicious for short-segment Barrett's esophagus. Biopsied. - 5 cm hiatal hernia. - Gastritis.  Biopsied.  Colon 07/2020 - Pancolonic diverticulosis predominantly in the sigmoid colon. - Erythematous mucosa in the terminal ileum with few erosions. Bx-inactive Crohn's disease. - Otherwise normal colonoscopy.  CTE 07/2020 1. There are multiple loops of distal ileum, which are somewhat thickened and narrowed appearing. One of these loops corresponds to a previously identified nidus of distal small bowel obstruction, which has since been relieved. This appearance presumably reflects some degree of stricture without evidence of current obstruction. 2. There is mild mucosal hyperenhancement of the sigmoid colon. 3. Constellation of findings is generally in keeping with inflammatory bowel disease, particularly Crohn's disease, with minimal evidence of acute inflammation on this examination. 4. Pancolonic diverticulosis, most notable in the descending and sigmoid colon. 5. Mild urinary bladder wall thickening, likely related to chronic outlet obstruction   Colonoscopy 08/2015 ([facility]) good preparation.  Colonic polyp s/p polypectomy.  Moderate predominantly sigmoid diverticulosis.  Otherwise normal to TI. Bx- neg  ERCP 06/2012: Choledocholithiasis s/p biliary sphincterotomy and stone extraction.  CT ar [facility]- per patient's wife.  I could not get any information through epic.  Report awaited. Past Medical History:  Diagnosis Date   Anxiety    Diabetes mellitus (HCC)    GERD (gastroesophageal reflux disease)    Hepatitis A    Hepatitis-C    Dr 07/2012. False positive hepatitis C antibodies   Hypercholesterolemia    Hypertension    Organic impotence    RA (rheumatoid arthritis) (HCC)     Past Surgical History:  Procedure Laterality Date  CHOLECYSTECTOMY  2014   With choledocholithiasis s/p ERCP with ES   COLONOSCOPY  09/11/2015   Colonic polyp status post polypectomy. Moderate predominantly sigmoid diverticulosis   ERCP  07/15/2012   Choledocholithiasis staus post biliary  sphrincterotomy with stone extraction.   HAND SURGERY Right    INGUINAL HERNIA REPAIR  07/13/2003   S/p inguinal herniorraphy     Family History  Problem Relation Age of Onset   Rheumatologic disease Father    Colon cancer Neg Hx    Liver disease Neg Hx    Esophageal cancer Neg Hx    Pancreatic cancer Neg Hx    Stomach cancer Neg Hx     Social History   Tobacco Use   Smoking status: Never   Smokeless tobacco: Never  Substance Use Topics   Alcohol use: Yes   Drug use: Never    Current Outpatient Medications  Medication Sig Dispense Refill   escitalopram (LEXAPRO) 10 MG tablet Take 1 tablet by mouth daily.     ferrous sulfate 325 (65 FE) MG EC tablet Take 1 tablet by mouth daily with breakfast.     metFORMIN (GLUCOPHAGE-XR) 500 MG 24 hr tablet Take by mouth.     Multiple Vitamin (MULTIVITAMIN) capsule Take 1 capsule by mouth daily.     Olmesartan-amLODIPine-HCTZ 40-5-25 MG TABS Take by mouth.     omeprazole (PRILOSEC) 40 MG capsule Take 1 capsule (40 mg total) by mouth daily. 90 capsule 3   ORENCIA 125 MG/ML SOSY Inject into the skin.     pantoprazole (PROTONIX) 40 MG tablet Take 1 tablet (40 mg total) by mouth daily. 90 tablet 3   polyvinyl alcohol (LIQUIFILM TEARS) 1.4 % ophthalmic solution Place 1 drop into both eyes as needed.     No current facility-administered medications for this visit.    No Known Allergies  Review of Systems:  neg     Physical Exam:    BP 132/80   Pulse 75   Ht 5\' 8"  (1.727 m)   Wt 175 lb 4 oz (79.5 kg)   SpO2 98%   BMI 26.65 kg/m  Wt Readings from Last 3 Encounters:  09/04/20 175 lb 4 oz (79.5 kg)  07/31/20 175 lb (79.4 kg)  06/19/20 175 lb 4 oz (79.5 kg)   Constitutional:  Well-developed, in no acute distress. Psychiatric: Normal mood and affect. Behavior is normal. Pulmonary/chest: Effort normal and breath sounds normal. No wheezing, rales or rhonchi. Abdominal: Soft, nondistended. Nontender. Bowel sounds active throughout.  There are no masses palpable. No hepatomegaly. Neurological: Alert and oriented to person place and time. Skin: Skin is warm and dry. No rashes noted.   08/19/20, MD 09/04/2020, 2:42 PM  Cc: Dr. 09/06/2020

## 2020-09-04 NOTE — Patient Instructions (Addendum)
If you are age 68 or older, your body mass index should be between 23-30. Your Body mass index is 26.65 kg/m. If this is out of the aforementioned range listed, please consider follow up with your Primary Care Provider.  If you are age 85 or younger, your body mass index should be between 19-25. Your Body mass index is 26.65 kg/m. If this is out of the aformentioned range listed, please consider follow up with your Primary Care Provider.   __________________________________________________________  The [facility] providers would like to encourage you to use [facility] to communicate with providers for non-urgent requests or questions.  Due to long hold times on the telephone, sending your provider a message by [facility] may be a faster and more efficient way to get a response.  Please allow 48 business hours for a response.  Please remember that this is for non-urgent requests.   We have sent the following medications to your pharmacy for you to pick up at your convenience: Omeprazole at [facility]  Please call in 1 year to schedule an office visit.   Please call with any questions or concerns.  Thank you,  Dr. Lynann Bologna

## 2023-10-20 ENCOUNTER — Encounter: Payer: Self-pay | Admitting: Gastroenterology

## 2023-12-01 ENCOUNTER — Ambulatory Visit (AMBULATORY_SURGERY_CENTER)

## 2023-12-01 VITALS — Ht 69.0 in | Wt 167.4 lb

## 2023-12-01 DIAGNOSIS — K219 Gastro-esophageal reflux disease without esophagitis: Secondary | ICD-10-CM

## 2023-12-01 DIAGNOSIS — K50119 Crohn's disease of large intestine with unspecified complications: Secondary | ICD-10-CM

## 2023-12-01 MED ORDER — NA SULFATE-K SULFATE-MG SULF 17.5-3.13-1.6 GM/177ML PO SOLN: 1.0000 | Freq: Once | ORAL | 0 refills | Status: AC

## 2023-12-01 NOTE — Progress Notes (Signed)

## 2023-12-15 ENCOUNTER — Ambulatory Visit (AMBULATORY_SURGERY_CENTER): Admitting: Gastroenterology

## 2023-12-15 ENCOUNTER — Encounter: Payer: Self-pay | Admitting: Gastroenterology

## 2023-12-15 VITALS — BP 110/60 | HR 64 | Temp 98.5°F | Resp 13 | Ht 69.0 in | Wt 167.4 lb

## 2023-12-15 DIAGNOSIS — K621 Rectal polyp: Secondary | ICD-10-CM | POA: Diagnosis not present

## 2023-12-15 DIAGNOSIS — K640 First degree hemorrhoids: Secondary | ICD-10-CM

## 2023-12-15 DIAGNOSIS — K219 Gastro-esophageal reflux disease without esophagitis: Secondary | ICD-10-CM

## 2023-12-15 DIAGNOSIS — K633 Ulcer of intestine: Secondary | ICD-10-CM

## 2023-12-15 DIAGNOSIS — K2950 Unspecified chronic gastritis without bleeding: Secondary | ICD-10-CM | POA: Diagnosis not present

## 2023-12-15 DIAGNOSIS — K5730 Diverticulosis of large intestine without perforation or abscess without bleeding: Secondary | ICD-10-CM

## 2023-12-15 DIAGNOSIS — K2270 Barrett's esophagus without dysplasia: Secondary | ICD-10-CM | POA: Diagnosis not present

## 2023-12-15 DIAGNOSIS — K449 Diaphragmatic hernia without obstruction or gangrene: Secondary | ICD-10-CM | POA: Diagnosis not present

## 2023-12-15 DIAGNOSIS — K50119 Crohn's disease of large intestine with unspecified complications: Secondary | ICD-10-CM

## 2023-12-15 DIAGNOSIS — D128 Benign neoplasm of rectum: Secondary | ICD-10-CM

## 2023-12-15 DIAGNOSIS — K5000 Crohn's disease of small intestine without complications: Secondary | ICD-10-CM

## 2023-12-15 MED ORDER — OMEPRAZOLE 40 MG PO CPDR
40.0000 mg | DELAYED_RELEASE_CAPSULE | Freq: Two times a day (BID) | ORAL | 4 refills | Status: AC
Start: 2023-12-15 — End: ?

## 2023-12-15 MED ORDER — SODIUM CHLORIDE 0.9 % IV SOLN: 500.0000 mL | INTRAVENOUS | Status: DC

## 2023-12-15 NOTE — Patient Instructions (Signed)
 YOU HAD AN ENDOSCOPIC PROCEDURE TODAY AT THE [facility]:   Refer to the procedure report that was given to you for any specific questions about what was found during the examination.  If the procedure report does not answer your questions, please call your gastroenterologist to clarify.  If you requested that your care partner not be given the details of your procedure findings, then the procedure report has been included in a sealed envelope for you to review at your convenience later.  YOU SHOULD EXPECT: Some feelings of bloating in the abdomen. Passage of more gas than usual.  Walking can help get rid of the air that was put into your GI tract during the procedure and reduce the bloating. If you had a lower endoscopy (such as a colonoscopy or flexible sigmoidoscopy) you may notice spotting of blood in your stool or on the toilet paper. If you underwent a bowel prep for your procedure, you may not have a normal bowel movement for a few days.  Please Note:  You might notice some irritation and congestion in your nose or some drainage.  This is from the oxygen used during your procedure.  There is no need for concern and it should clear up in a day or so.  SYMPTOMS TO REPORT IMMEDIATELY:  Following lower endoscopy (colonoscopy or flexible sigmoidoscopy):  Excessive amounts of blood in the stool  Significant tenderness or worsening of abdominal pains  Swelling of the abdomen that is new, acute  Fever of 100F or higher  Following upper endoscopy (EGD)  Vomiting of blood or coffee ground material  New chest pain or pain under the shoulder blades  Painful or persistently difficult swallowing  New shortness of breath  Fever of 100F or higher  Black, tarry-looking stools  Resume previous diet Continue present medications Await pathology results Recommend to check B12 periodically.  Patient is to promptly get in touch with us  in case of any symptoms No repeat colonoscopy is  recommended for screening purposes Handouts on diverticulosis, hiatal hernia and hemorrhoids given.    For urgent or emergent issues, a gastroenterologist can be reached at any hour by calling [phone]. Do not use MyChart messaging for urgent concerns.    DIET:  We do recommend a small meal at first, but then you may proceed to your regular diet.  Drink plenty of fluids but you should avoid alcoholic beverages for 24 hours.  ACTIVITY:  You should plan to take it easy for the rest of today and you should NOT DRIVE or use heavy machinery until tomorrow (because of the sedation medicines used during the test).    FOLLOW UP: Our staff will call the number listed on your records the next business day following your procedure.  We will call around 7:15- 8:00 am to check on you and address any questions or concerns that you may have regarding the information given to you following your procedure. If we do not reach you, we will leave a message.     If any biopsies were taken you will be contacted by phone or by letter within the next 1-3 weeks.  Please call us  at [phone] if you have not heard about the biopsies in 3 weeks.    SIGNATURES/CONFIDENTIALITY: You and/or your care partner have signed paperwork which will be entered into your electronic medical record.  These signatures attest to the fact that that the information above on your After Visit Summary has been reviewed and is  understood.  Full responsibility of the confidentiality of this discharge information lies with you and/or your care-partner.

## 2023-12-15 NOTE — Progress Notes (Signed)
 Sedate, gd SR, tolerated procedure well, VSS, report to RN

## 2023-12-15 NOTE — Op Note (Addendum)
 [facility] Patient Name: Brent Mullins Procedure Date: 12/15/2023 8:57 AM MRN: [id number] Endoscopist: Lynnie Bring , MD, [id number] Age: 73 Referring MD:  Date of Birth: 1950-08-20 Gender: Male Account #: [id number] Procedure:                Colonoscopy Indications:              Screening for colorectal malignant neoplasm. H/O                            mild Crohn's disease involving terminal ileum. Medicines:                Monitored Anesthesia Care Procedure:                Pre-Anesthesia Assessment:                           - Prior to the procedure, a History and Physical                            was performed, and patient medications and                            allergies were reviewed. The patient's tolerance of                            previous anesthesia was also reviewed. The risks                            and benefits of the procedure and the sedation                            options and risks were discussed with the patient.                            All questions were answered, and informed consent                            was obtained. Prior Anticoagulants: The patient has                            taken no anticoagulant or antiplatelet agents. ASA                            Grade Assessment: II - A patient with mild systemic                            disease. After reviewing the risks and benefits,                            the patient was deemed in satisfactory condition to                            undergo the procedure.  After obtaining informed consent, the colonoscope                            was passed under direct vision. Throughout the                            procedure, the patient's blood pressure, pulse, and                            oxygen saturations were monitored continuously. The                            Olympus Scope CF SN: [id number] was introduced through                            the anus  and advanced to the 4 cm into the ileum.                            The colonoscopy was performed without difficulty.                            The patient tolerated the procedure well. The                            quality of the bowel preparation was adequate to                            identify polyps. The terminal ileum, ileocecal                            valve, appendiceal orifice, and rectum were                            photographed. Scope In: 9:12:03 AM Scope Out: 9:29:53 AM Scope Withdrawal Time: 0 hours 11 minutes 50 seconds  Total Procedure Duration: 0 hours 17 minutes 50 seconds  Findings:                 One small erosion was noted at the ileocecal                            valve/distal terminal ileum. Otherwise TI was                            unremarkable. No strictures were noted. 4 to 5 cm                            the terminal ileum was intubated. Biopsies were                            taken with a cold forceps for histology. The                            colonic mucosa was normal throughout  with                            well-preserved vascular pattern. No evidence of                            perianal Crohn's disease.                           Multiple medium-mouthed diverticula were found in                            the sigmoid colon, descending colon and ascending                            colon. One 2 mm polyp was noted in the rectum.                            Removed by cold biopsy forceps.                           Non-bleeding internal hemorrhoids were found during                            retroflexion. The hemorrhoids were small and Grade                            I (internal hemorrhoids that do not prolapse).                           Retroflexion in the right colon was performed.                           The exam was otherwise without abnormality on                            direct and retroflexion views. Complications:            No  immediate complications. Estimated Blood Loss:     Estimated blood loss: none. Impression:               - Single erosion in terminal ileum. No strictures                            or active Crohn's otherwise. Biopsied.                           - Small rectal polyp s/p polypectomy.                           - Moderate pancolonic diverticulosis.                           - Non-bleeding internal hemorrhoids.                           - The examination was  otherwise normal on direct                            and retroflexion views. Recommendation:           - Patient has a contact number available for                            emergencies. The signs and symptoms of potential                            delayed complications were discussed with the                            patient. Return to normal activities tomorrow.                            Written discharge instructions were provided to the                            patient.                           - Resume previous diet.                           - Continue present medications.                           - Await pathology results.                           - No therapy needed for above. Recommend to check                            B12 periodically. Patient is to promptly get in                            touch with us  in case of any symptoms.                           - Repeat colonoscopy is not recommended for                            screening purposes.                           - The findings and recommendations were discussed                            with the patient's family. Lynnie Bring, MD 12/15/2023 9:38:57 AM This report has been signed electronically.

## 2023-12-15 NOTE — Progress Notes (Signed)
 Chief Complaint: FU  Referring Provider:  Dr Thurmond      ASSESSMENT AND PLAN;   #1. GERD with mod HH with short segment Barrett's esophagus on EGD 07/2020.  #2. Inactive Crohn's disease involving distal ileum-more fibrotic component than inflammatory.   H/O PSBO adm to [facility] 6-8 yrs ago (managed conservatively). Nl CBC, CMP, CRP (08/2020)  #3. IDA with heme -ve stools (resolved). Hb 14.6 (08/2020)   Plan: -Continue protonix  40mg  QD #90, 4 refills -He does understand that he is at risk of having PSBO in future.  He will promptly get in touch with us  in case of any problems. -At this time, since Crohn's appears to be inactive, we decided to hold off on treatment.  Besides, interestingly, he has been on Humira in past for RA and is currently on Orencia. -Rpt EGD and colon in 3 years.      HPI:    Brent Mullins is a 73 y.o. male  With GERD, DM2, RA(humira stopped working, now on orencia, HTN, HLD, anxiety, s/p lap chole 2014 with choledocholithiasis s/p ERCP  Very pleasant patient.  Underwent CTE 07/2020 showing multiple loops of distal ileum with thickening c/w Crohn's disease.  He underwent colonoscopy which showed few erosions in TI. Bx came back as inactive Crohn's disease.  EGD showed 5 cm HH with short segment Barrett's esophagus without dysplasia.  He had normal CBC, CMP and CRP.  His GI symptoms have more or less resolved completely.  No further heartburn, nausea, vomiting, abdominal pain or abdominal distention.  Interestingly, he has been on Humira for several years due to RA and has recently been switched to Orencia (selective T-cell cord stimulation blocker)-which likely is effective for Crohn's disease as well.  Also, he took several courses of steroids for rheumatoid arthritis in past.    Past GI procedures:  EGD 07/2020 - Esophageal mucosal changes suspicious for short-segment Barrett's esophagus. Biopsied. - 5 cm hiatal hernia. - Gastritis.  Biopsied.  Colon 07/2020 - Pancolonic diverticulosis predominantly in the sigmoid colon. - Erythematous mucosa in the terminal ileum with few erosions. Bx-inactive Crohn's disease. - Otherwise normal colonoscopy.  CTE 07/2020 1. There are multiple loops of distal ileum, which are somewhat thickened and narrowed appearing. One of these loops corresponds to a previously identified nidus of distal small bowel obstruction, which has since been relieved. This appearance presumably reflects some degree of stricture without evidence of current obstruction. 2. There is mild mucosal hyperenhancement of the sigmoid colon. 3. Constellation of findings is generally in keeping with inflammatory bowel disease, particularly Crohn's disease, with minimal evidence of acute inflammation on this examination. 4. Pancolonic diverticulosis, most notable in the descending and sigmoid colon. 5. Mild urinary bladder wall thickening, likely related to chronic outlet obstruction   Colonoscopy 08/2015 ([facility]) good preparation.  Colonic polyp s/p polypectomy.  Moderate predominantly sigmoid diverticulosis.  Otherwise normal to TI. Bx- neg  ERCP 06/2012: Choledocholithiasis s/p biliary sphincterotomy and stone extraction.  CT ar [facility]- per patient's wife.  I could not get any information through epic.  Report awaited. Past Medical History:  Diagnosis Date   Anxiety    Diabetes mellitus (HCC)    GERD (gastroesophageal reflux disease)    Hepatitis A    Hepatitis-C    Dr Luis. False positive hepatitis C antibodies   Hypercholesterolemia    Hypertension    Organic impotence    RA (rheumatoid arthritis) (HCC)     Past Surgical History:  Procedure Laterality Date  CHOLECYSTECTOMY  2014   With choledocholithiasis s/p ERCP with ES   COLONOSCOPY  09/11/2015   Colonic polyp status post polypectomy. Moderate predominantly sigmoid diverticulosis   ERCP  07/15/2012   Choledocholithiasis staus post biliary  sphrincterotomy with stone extraction.   HAND SURGERY Right    INGUINAL HERNIA REPAIR  07/13/2003   S/p inguinal herniorraphy     Family History  Problem Relation Age of Onset   Rheumatologic disease Father    Colon cancer Neg Hx    Liver disease Neg Hx    Esophageal cancer Neg Hx    Pancreatic cancer Neg Hx    Stomach cancer Neg Hx    Rectal cancer Neg Hx     Social History   Tobacco Use   Smoking status: Never   Smokeless tobacco: Never  Vaping Use   Vaping status: Never Used  Substance Use Topics   Alcohol use: Not Currently    Comment: socially   Drug use: Never    Current Outpatient Medications  Medication Sig Dispense Refill   acetaminophen (TYLENOL) 650 MG CR tablet Take 650 mg by mouth every 8 (eight) hours as needed.     escitalopram (LEXAPRO) 10 MG tablet Take 1 tablet by mouth daily.     metFORMIN (GLUCOPHAGE-XR) 500 MG 24 hr tablet Take by mouth.     Olmesartan-amLODIPine-HCTZ 40-5-25 MG TABS Take by mouth.     omeprazole  (PRILOSEC) 40 MG capsule Take 1 capsule (40 mg total) by mouth daily. 90 capsule 3   polyvinyl alcohol (LIQUIFILM TEARS) 1.4 % ophthalmic solution Place 1 drop into both eyes as needed.     sildenafil (REVATIO) 20 MG tablet Take 20 mg by mouth as needed.     ferrous sulfate 325 (65 FE) MG EC tablet Take 1 tablet by mouth daily with breakfast.     Multiple Vitamin (MULTIVITAMIN) capsule Take 1 capsule by mouth daily.     ORENCIA 125 MG/ML SOSY Inject into the skin.     pantoprazole  (PROTONIX ) 40 MG tablet Take 1 tablet (40 mg total) by mouth daily. 90 tablet 3   Current Facility-Administered Medications  Medication Dose Route Frequency Provider Last Rate Last Admin   0.9 %  sodium chloride  infusion  500 mL Intravenous Continuous Charlanne Groom, MD        Allergies  Allergen Reactions   Empagliflozin Other (See Comments)    Urinary frequency, dizziness    Review of Systems:  neg     Physical Exam:    BP (!) 152/97   Pulse 64    Temp 98.5 F (36.9 C) (Temporal)   Ht 5' 9 (1.753 m)   Wt 167 lb 6.4 oz (75.9 kg)   SpO2 98%   BMI 24.72 kg/m  Wt Readings from Last 3 Encounters:  12/15/23 167 lb 6.4 oz (75.9 kg)  12/01/23 167 lb 6.4 oz (75.9 kg)  09/04/20 175 lb 4 oz (79.5 kg)   Constitutional:  Well-developed, in no acute distress. Psychiatric: Normal mood and affect. Behavior is normal. Pulmonary/chest: Effort normal and breath sounds normal. No wheezing, rales or rhonchi. Abdominal: Soft, nondistended. Nontender. Bowel sounds active throughout. There are no masses palpable. No hepatomegaly. Neurological: Alert and oriented to person place and time. Skin: Skin is warm and dry. No rashes noted.   Anselm Charlanne, MD 12/15/2023, 8:55 AM  Cc: Dr. Grisso

## 2023-12-15 NOTE — Progress Notes (Signed)
 Pt's states no medical or surgical changes since previsit or office visit.

## 2023-12-15 NOTE — Op Note (Signed)
 [facility] Patient Name: Brent Mullins Procedure Date: 12/15/2023 8:57 AM MRN: [id number] Endoscopist: Lynnie Bring , MD, [id number] Age: 73 Referring MD:  Date of Birth: [birth year] Gender: Male Account #: [id number] Procedure:                Upper GI endoscopy Indications:              H/O Barrett's esophagus Medicines:                Monitored Anesthesia Care Procedure:                Pre-Anesthesia Assessment:                           - Prior to the procedure, a History and Physical                            was performed, and patient medications and                            allergies were reviewed. The patient's tolerance of                            previous anesthesia was also reviewed. The risks                            and benefits of the procedure and the sedation                            options and risks were discussed with the patient.                            All questions were answered, and informed consent                            was obtained. Prior Anticoagulants: The patient has                            taken no anticoagulant or antiplatelet agents. ASA                            Grade Assessment: II - A patient with mild systemic                            disease. After reviewing the risks and benefits,                            the patient was deemed in satisfactory condition to                            undergo the procedure.                           After obtaining informed consent, the endoscope was  passed under direct vision. Throughout the                            procedure, the patient's blood pressure, pulse, and                            oxygen saturations were monitored continuously. The                            Olympus Scope [id number] was introduced through the                            mouth, and advanced to the second part of duodenum.                            The upper GI endoscopy was  accomplished without                            difficulty. The patient tolerated the procedure                            well. Scope In: Scope Out: Findings:                 There were esophageal mucosal changes consistent                            with short-segment Barrett's esophagus present in                            the lower third of the esophagus extending from 33                            up to 35 cm (most proximal margin to GE junction).                            Mucosa was biopsied with a cold forceps for                            histology in 4 quadrants at intervals of 2 cm. A                            total of 2 specimen bottles were sent to pathology.                           A 4-5 cm hiatal hernia was present.                           The entire examined stomach was normal.                           The in the duodenum was normal. Complications:            No immediate complications. Estimated Blood Loss:     Estimated  blood loss: none. Impression:               - Esophageal mucosal changes consistent with                            short-segment Barrett's esophagus. Biopsied.                           - Mod hiatal hernia.                           - Otherwise normal EGD. Recommendation:           - Patient has a contact number available for                            emergencies. The signs and symptoms of potential                            delayed complications were discussed with the                            patient. Return to normal activities tomorrow.                            Written discharge instructions were provided to the                            patient.                           - Resume previous diet.                           - Continue present medications.                           - Await pathology results.                           - The findings and recommendations were discussed                            with the patient's  family. Lynnie Bring, MD 12/15/2023 9:40:45 AM This report has been signed electronically.

## 2023-12-15 NOTE — Progress Notes (Signed)
 Called to room to assist during endoscopic procedure.  Patient ID and intended procedure confirmed with present staff. Received instructions for my participation in the procedure from the performing physician.

## 2023-12-16 ENCOUNTER — Telehealth: Payer: Self-pay

## 2023-12-16 NOTE — Telephone Encounter (Signed)
 Left message on follow up call.

## 2023-12-17 LAB — SURGICAL PATHOLOGY

## 2023-12-19 ENCOUNTER — Ambulatory Visit: Payer: Self-pay | Admitting: Gastroenterology
